# Patient Record
Sex: Female | Born: 1995 | Race: White | Hispanic: No | Marital: Single | Smoking: Never smoker
Health system: Southern US, Community
[De-identification: ages and names within clinical notes are randomized; demographics above are authoritative.]

## PROBLEM LIST (undated history)

## (undated) DIAGNOSIS — K602 Anal fissure, unspecified: Secondary | ICD-10-CM

## (undated) DIAGNOSIS — J3081 Allergic rhinitis due to animal (cat) (dog) hair and dander: Secondary | ICD-10-CM

## (undated) DIAGNOSIS — J45909 Unspecified asthma, uncomplicated: Secondary | ICD-10-CM

## (undated) DIAGNOSIS — J189 Pneumonia, unspecified organism: Secondary | ICD-10-CM

## (undated) DIAGNOSIS — K120 Recurrent oral aphthae: Secondary | ICD-10-CM

## (undated) HISTORY — DX: Anal fissure, unspecified: K60.2

## (undated) HISTORY — DX: Pneumonia, unspecified organism: J18.9

## (undated) HISTORY — DX: Allergic rhinitis due to animal (cat) (dog) hair and dander: J30.81

## (undated) HISTORY — DX: Unspecified asthma, uncomplicated: J45.909

## (undated) HISTORY — PX: TONSILLECTOMY: SUR1361

## (undated) HISTORY — DX: Recurrent oral aphthae: K12.0

---
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
## 2015-12-23 ENCOUNTER — Ambulatory Visit: Payer: Self-pay | Admitting: Podiatry

## 2016-11-02 DIAGNOSIS — J3081 Allergic rhinitis due to animal (cat) (dog) hair and dander: Secondary | ICD-10-CM

## 2016-11-02 HISTORY — DX: Allergic rhinitis due to animal (cat) (dog) hair and dander: J30.81

## 2016-11-23 HISTORY — PX: COLONOSCOPY: SHX174

## 2017-01-08 ENCOUNTER — Telehealth: Payer: Self-pay | Admitting: Internal Medicine

## 2017-01-08 NOTE — Telephone Encounter (Signed)
Received GI records from Dr. Kenna GilbertMann's office. Patient states that her brother is a patient of Dr. Marvell FullerGessner's and is requesting to be seen here. Patient states that she is having abdominal cramping, blood in stool, and swollen joints and feels that Dr. Loreta AveMann is "missing" something in her care. Records placed on Dr. Marvell FullerGessner's desk for review.

## 2017-01-21 ENCOUNTER — Telehealth: Payer: Self-pay

## 2017-01-21 NOTE — Telephone Encounter (Signed)
-----   Message from Iva Booparl E Gessner, MD sent at 01/21/2017  5:22 PM EDT ----- Regarding: appt Mon or a Friday I have agreed to see her re: abd pain and rectal bleeding  I had spoken to Mom about it -   Warden FillersLiza goes to college in [place] but is available Mondays and Fridays  Can you see if we can work one out for her to see me  I am ok adding her in/on  #'s in demographics are Moms

## 2017-01-21 NOTE — Telephone Encounter (Signed)
Left detailed message on the mobile # to call me back to set up appointment. I hope to work her in 02/01/17 AM or PM.

## 2017-01-22 NOTE — Telephone Encounter (Signed)
Spoke with Mom and we will see her 02/01/17 at 9:15AM. She has to be be back in [place] by 4:00pm.

## 2017-02-01 ENCOUNTER — Ambulatory Visit (INDEPENDENT_AMBULATORY_CARE_PROVIDER_SITE_OTHER): Payer: BC Managed Care – PPO | Admitting: Internal Medicine

## 2017-02-01 ENCOUNTER — Encounter (INDEPENDENT_AMBULATORY_CARE_PROVIDER_SITE_OTHER): Payer: Self-pay

## 2017-02-01 ENCOUNTER — Encounter: Payer: Self-pay | Admitting: Internal Medicine

## 2017-02-01 VITALS — BP 104/78 | HR 67 | Resp 12 | Ht 69.0 in | Wt 127.0 lb

## 2017-02-01 DIAGNOSIS — R1013 Epigastric pain: Secondary | ICD-10-CM

## 2017-02-01 DIAGNOSIS — K5909 Other constipation: Secondary | ICD-10-CM

## 2017-02-01 DIAGNOSIS — K601 Chronic anal fissure: Secondary | ICD-10-CM

## 2017-02-01 DIAGNOSIS — R6881 Early satiety: Secondary | ICD-10-CM | POA: Diagnosis not present

## 2017-02-01 MED ORDER — POLYETHYLENE GLYCOL 3350 17 G PO PACK: PACK | ORAL | 0 refills | Status: AC

## 2017-02-01 MED ORDER — DICYCLOMINE HCL 10 MG PO CAPS: 10.0000 mg | ORAL_CAPSULE | Freq: Three times a day (TID) | ORAL | 2 refills | Status: AC

## 2017-02-01 NOTE — Progress Notes (Signed)
Maureen Ramos 20 y.o. [birth year] [id number]  Assessment & Plan:   Encounter Diagnoses  Name Primary?  . Chronic anterior anal fissure Yes  . Other constipation   . Dyspepsia   . Early satiety     At this point it is most likely a functional GI disturbance with an anal fissure. The possibility of inflammatory bowel disease does exist still that the negative colonoscopy and normal labs go against that. He does have a history of mouth ulcers, also Maureen Ramos had a pretibial painful rash when Maureen Ramos was attending school in [place] last fall, wonder if that was erythema nodosum though not sure.  Current plan is to treat with dicyclomine 10 mg before meals. Side effects discussed. MiraLAX daily at bedtime approximately.  I will see Maureen Ramos back in mid May after Maureen Ramos gets out of school and before Maureen Ramos starts work in [place], [place]  If needed upper endoscopy and possibly magnetic resonance enterography would be entertained, to sort out whether or not Maureen Ramos has inflammatory conditions of the GI tract beyond what we have seen so far with Maureen Ramos negative colonoscopy. I thought Maureen Ramos had been tested for celiac disease but perhaps not so we'll keep that in mind.One brother has Crohn's disease and another brother has a row pathic disorder that is associated with Crohn's disease though I don't think he has that also.  WU:JWJXBJ,YNWG A, MD Barrie Lyme, [facility]   Subjective:    Chief Complaint: Abdominal pain and rectal bleeding  HPI This 21 year old single white woman presents with Maureen Ramos mother due to several month history of abdominal pain, constipation and rectal bleeding. Maureen Ramos originally presented to gynecology and then was seen by Dr. Loreta Ave in January 2018. The patient had been having some recurrence of aphthous mouth ulcers, and describes that Maureen Ramos had a purplish rash on Maureen Ramos legs in September 2017, a pretibial painful papular rash.Maureen Ramos was attending school in [place] for a semester abroad. Maureen Ramos has lost Maureen Ramos  appetite and has early satiety. Maureen Ramos is now moving Maureen Ramos bowels about once a week. Maureen Ramos tried Linzess but it was too powerful. Maureen Ramos tried MiraLAX but not on a regular basis. I think Maureen Ramos has used stimulant laxatives. Maureen Ramos sees bright red blood with wiping or in the toilet or on the stool, Maureen Ramos has some pain with defecation and anal discomfort. Maureen Ramos is describing early satiety. Maureen Ramos lost about 6 pounds in the fall. Maureen Ramos was 127 pounds in January and Maureen Ramos is the same today. Blood work was performed by Dr. Loreta Ave in January showed a normal CBC normal chemistries including liver and renal function. TSH was also normal. Colonoscopy into the terminal ileum was normal.  No Known Allergies Current Meds  Medication Sig  . JUNEL 1/20 1-20 MG-MCG tablet Take by mouth daily.  Marland Kitchen PROAIR HFA 108 (90 Base) MCG/ACT inhaler as needed.   Past Medical History:  Diagnosis Date  . Aphthous ulcer    mouth  . Asthma   . Pneumonia    Past Surgical History:  Procedure Laterality Date  . COLONOSCOPY  11/23/2016   Normal to terminal ileum  . TONSILLECTOMY     Social History   Social History  . Marital status: Single    Spouse name: N/A  . Number of children: 0  . Years of education: N/A   Occupational History  . student    Social History Main Topics  . Smoking status: Never Smoker  . Smokeless tobacco: Never Used  . Alcohol use No  .  Drug use: No  . Sexual activity: Not Asked   Other Topics Concern  . 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  Review of Systems Positive for those things mentioned in the history of present illness. Allergies, some fatigue sweats myalgia cough fatigue. All of the past 6 months. All other review of systems are negative.  Objective:   Physical  Exam  104/78   Pulse 67   Resp 12   Ht  (1.753 m)   Wt 127 lb (57.6 kg)   LMP 01/28/2017 (LMP Unknown)   BMI 18.75 kg/m @  General:  Well-developed, well-nourished and in no acute distress Eyes:  anicteric. ENT:   Mouth and posterior pharynx free of lesions.  Neck:   supple w/o thyromegaly or mass.  Lungs: Clear to auscultation bilaterally. Heart:  S1S2, no rubs, murmurs, gallops. Abdomen:  soft, non-tender, no hepatosplenomegaly, hernia, or mass and BS+.  Rectal:  Patti [place], CMA present.  Anoderm shows a slight violaceous erythematous rash in the gluteal crease Anal wink is present Small anterior sentinel pile seen Normal resting tone Tender anteriorly and some induration consistent with a fissure Firm formed brown stool present normal voluntary squeeze appropriate abdominal contraction and descent with simulated defecation maneuver   Anoscopy is performed and confirms anterior anal fissure Patti [place], CMA present.  Lymph:  no cervical or supraclavicular adenopathy. Extremities:   no edema, cyanosis or clubbing Skin   no rash. Neuro:  A&O x 3.  Psych:  appropriate mood and  Affect.   Data Reviewed:  See history of present illness

## 2017-02-01 NOTE — Patient Instructions (Signed)
Please take a dose of Miralax nightly. Coupon provided.  If not helping contact us back.  Also call us back with your Linzess strength.   We have sent the following medications to your pharmacy for you to pick up at your convenience: Dicyclomine   Please follow up with [place] in May.

## 2017-03-15 ENCOUNTER — Encounter: Payer: Self-pay | Admitting: Internal Medicine

## 2017-03-15 ENCOUNTER — Ambulatory Visit (INDEPENDENT_AMBULATORY_CARE_PROVIDER_SITE_OTHER): Payer: BC Managed Care – PPO | Admitting: Internal Medicine

## 2017-03-15 VITALS — BP 102/66 | HR 70 | Ht 69.0 in | Wt 126.8 lb

## 2017-03-15 DIAGNOSIS — R1013 Epigastric pain: Secondary | ICD-10-CM | POA: Diagnosis not present

## 2017-03-15 DIAGNOSIS — K601 Chronic anal fissure: Secondary | ICD-10-CM | POA: Diagnosis not present

## 2017-03-15 DIAGNOSIS — K5909 Other constipation: Secondary | ICD-10-CM | POA: Diagnosis not present

## 2017-03-15 MED ORDER — DILTIAZEM GEL 2 %: 1.0000 "application " | Freq: Three times a day (TID) | CUTANEOUS | 2 refills | Status: AC

## 2017-03-15 NOTE — Progress Notes (Signed)
   Maureen Ramos 20 y.o. Oct 05, 1996 [id number]  Assessment &Patriciaann Clan Plan:   Encounter Diagnoses  Name Primary?  . Chronic anterior anal fissure Yes  . Other constipation   . Dyspepsia     She is improved. We'll add diltiazem gel and continue the MiraLAX to try to heal this fissure. I think this is probably a functional disturbance, if she has persistent issues we may need to do a capsule endoscopy of the small bowel. I will see her back in early August. Sooner as needed. I appreciate the opportunity to care for this patient. 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 She said that when she was on MiraLAX things were better she recently ran out she's had some hard stools and some pain with defecation and some slight bleeding again. She's had some intermittent bloating. She had one spell where she had to give her presentation in class and she was bloated and had some hot flashes and couldn't see quite right had to go outside and once she defecated she felt okay. That never happened before and she had done multiple presentations but any particular anxiety etc. She is accompanied by her mother today.  Review of Systems Wt Readings from Last 3 Encounters:  03/15/17 126 lb 12.8 oz (57.5 kg)  02/01/17 127 lb (57.6 kg)   She currently is having a cough think some allergy symptoms.  No Known Allergies Current medications were MiraLAX, dicyclomine 10 mg 3 times a day when necessary and birth control pills and a pro-air inhaler when necessary  Past Medical History:  Diagnosis Date  . Anal fissure   . Aphthous ulcer    mouth  . Asthma   . 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 Pneumonia    Past Surgical History:  Procedure Laterality Date  . COLONOSCOPY  11/23/2016   Normal to terminal ileum  . TONSILLECTOMY     Social history and family history are reviewed in the EMR  Objective:   Physical Exam BP 102/66   Pulse 70   Ht 5\' 9"  (1.753 m)   Wt 126 lb 12.8 oz (57.5 kg)   LMP 03/01/2017 (Exact Date)   BMI 18.73 kg/m  No acute distress Abdomen is soft nontender no masses Eyes are anicteric Appropriate mood and affect alert and oriented 3

## 2017-03-15 NOTE — Patient Instructions (Addendum)
   Please take the MiraLax some more and start the diltiazem gel - insert small amount with finger into the anal canal with a gloved finger. [facility]  Take it for about a month after you feel ok.  Daisey MustGood Luck with your summer internship!  See me again August 3 and reach out sooner if needed.  I appreciate the opportunity to care for you. Iva Booparl E. Gessner, MD, Clementeen GrahamFACG

## 2017-06-04 ENCOUNTER — Ambulatory Visit: Payer: BC Managed Care – PPO | Admitting: Internal Medicine

## 2020-11-18 DIAGNOSIS — Z1152 Encounter for screening for COVID-19: Secondary | ICD-10-CM | POA: Diagnosis not present

## 2020-12-18 DIAGNOSIS — Z3202 Encounter for pregnancy test, result negative: Secondary | ICD-10-CM | POA: Diagnosis not present

## 2020-12-18 DIAGNOSIS — Z113 Encounter for screening for infections with a predominantly sexual mode of transmission: Secondary | ICD-10-CM | POA: Diagnosis not present

## 2020-12-18 DIAGNOSIS — Z6820 Body mass index (BMI) 20.0-20.9, adult: Secondary | ICD-10-CM | POA: Diagnosis not present

## 2020-12-18 DIAGNOSIS — R69 Illness, unspecified: Secondary | ICD-10-CM | POA: Diagnosis not present

## 2020-12-18 DIAGNOSIS — N939 Abnormal uterine and vaginal bleeding, unspecified: Secondary | ICD-10-CM | POA: Diagnosis not present

## 2020-12-18 DIAGNOSIS — Z01419 Encounter for gynecological examination (general) (routine) without abnormal findings: Secondary | ICD-10-CM | POA: Diagnosis not present

## 2021-05-29 DIAGNOSIS — G8929 Other chronic pain: Secondary | ICD-10-CM | POA: Diagnosis not present

## 2021-05-29 DIAGNOSIS — M533 Sacrococcygeal disorders, not elsewhere classified: Secondary | ICD-10-CM | POA: Diagnosis not present

## 2021-05-29 DIAGNOSIS — M545 Low back pain, unspecified: Secondary | ICD-10-CM | POA: Diagnosis not present
